# Patient Record
Sex: Female | Born: 2005 | Race: Black or African American | Hispanic: No | Marital: Single | Smoking: Never smoker
Health system: Southern US, Community
[De-identification: ages and names within clinical notes are randomized; demographics above are authoritative.]

## PROBLEM LIST (undated history)

## (undated) DIAGNOSIS — J45909 Unspecified asthma, uncomplicated: Secondary | ICD-10-CM

---
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
## 2023-03-28 IMAGING — DX DG FOOT COMPLETE 3+V*R*
3 series · 3 of 3 positions shown · non-contrast
Comparison: None.

CLINICAL DATA: Stepped on nail a few days ago. Question foreign
object.

EXAM:
RIGHT FOOT COMPLETE - 3+ VIEW

[foot ap]
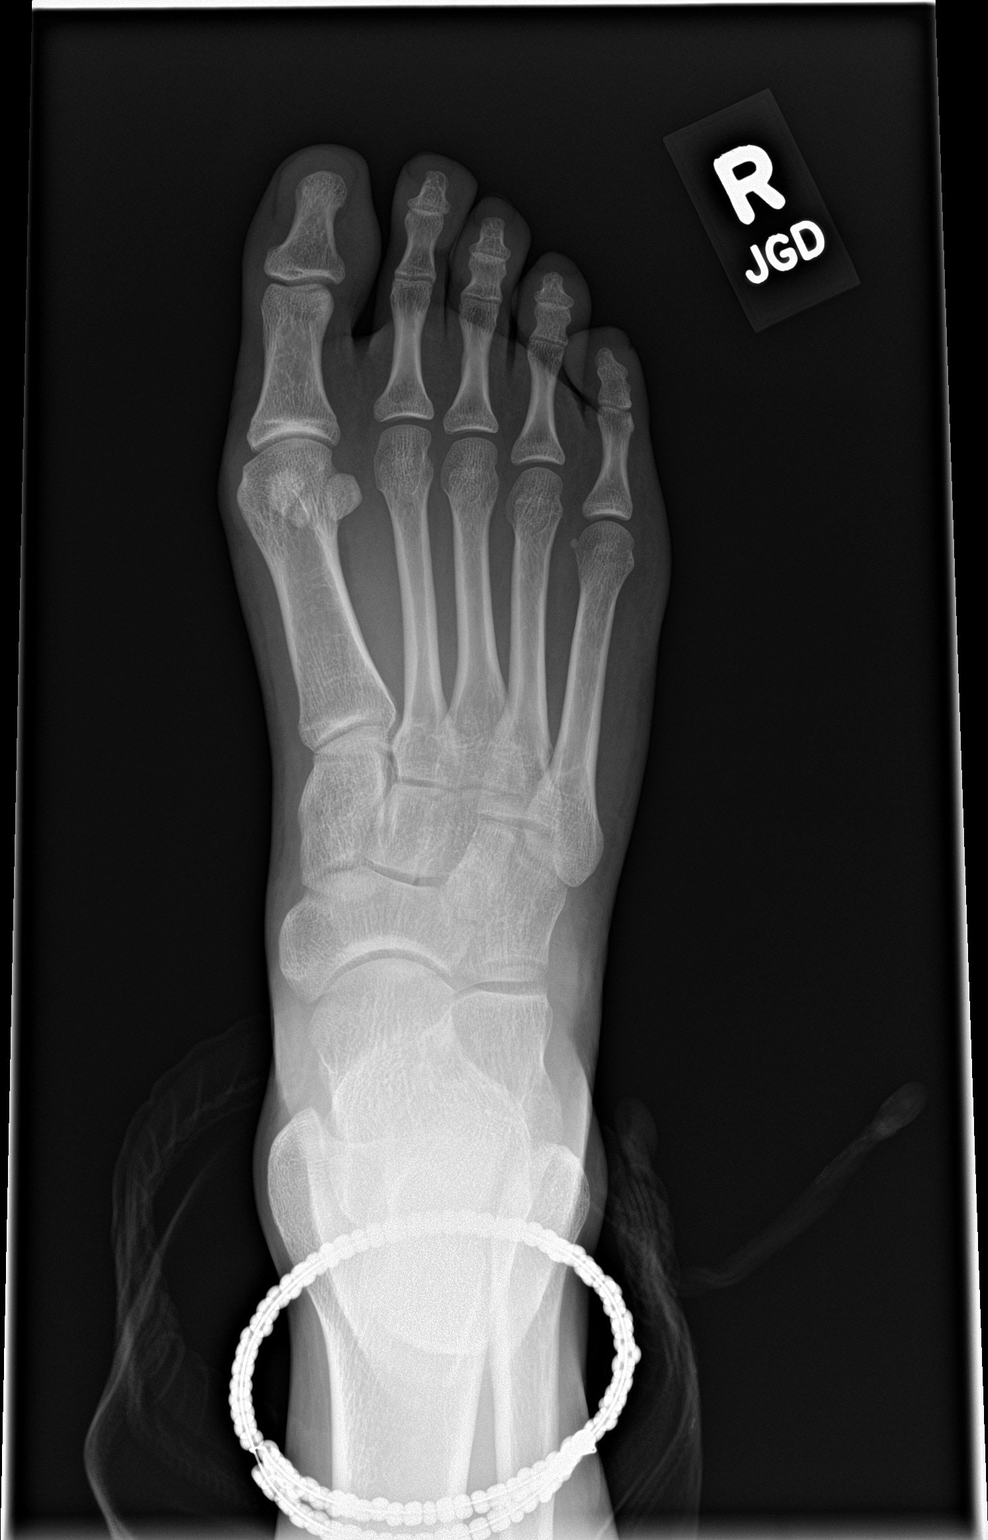

[foot obl]
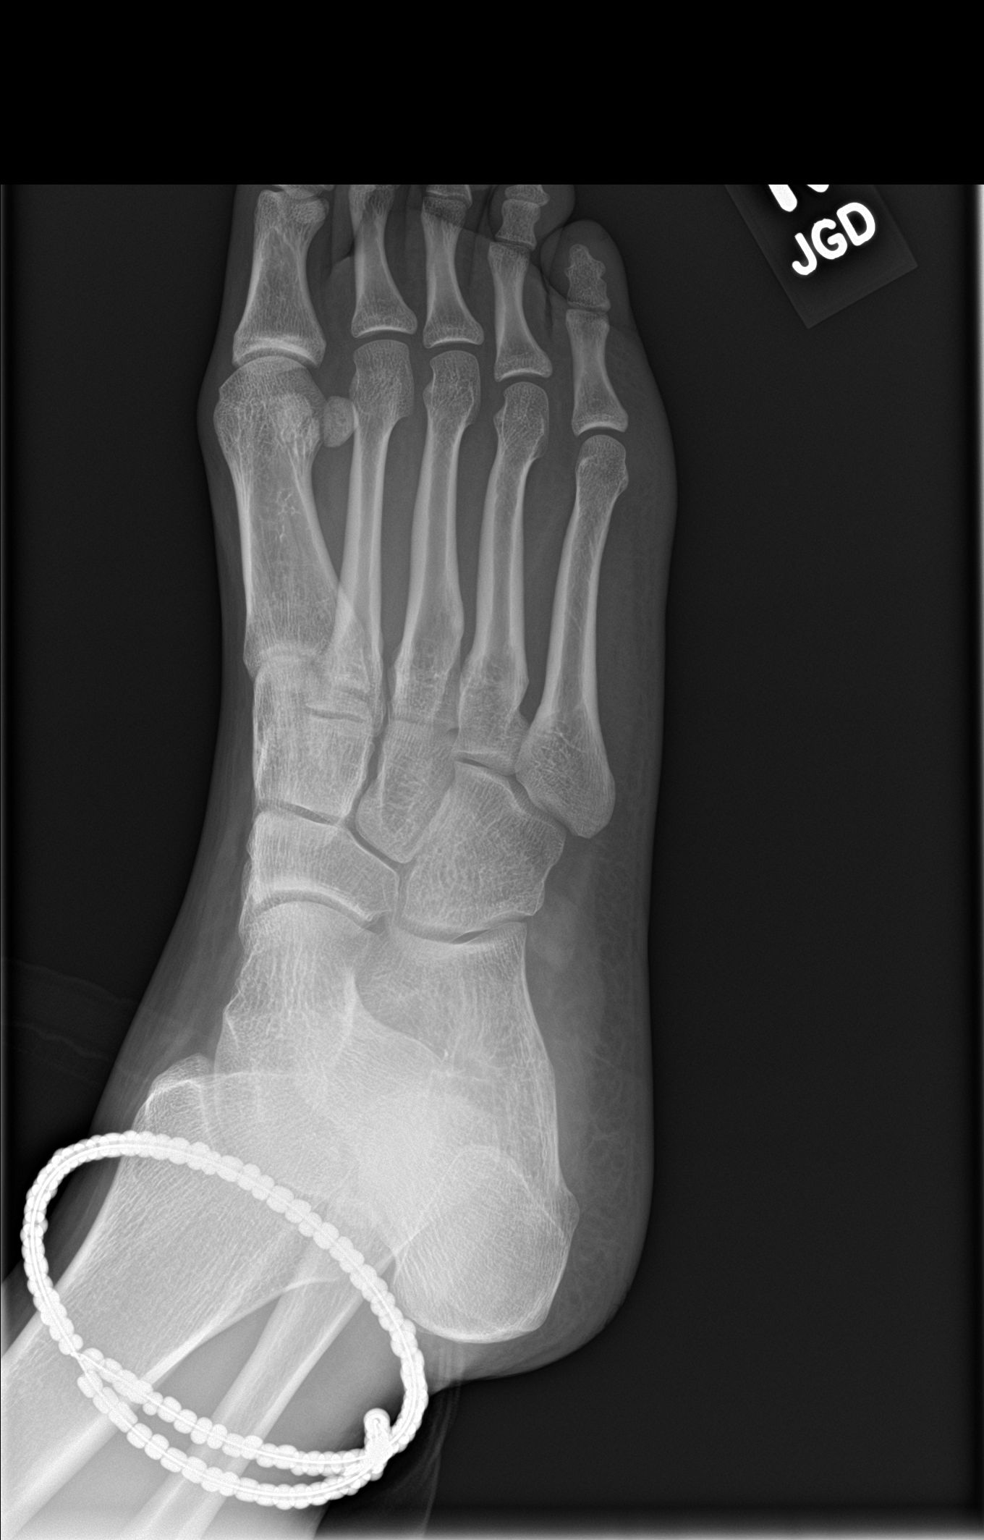

[foot lat]
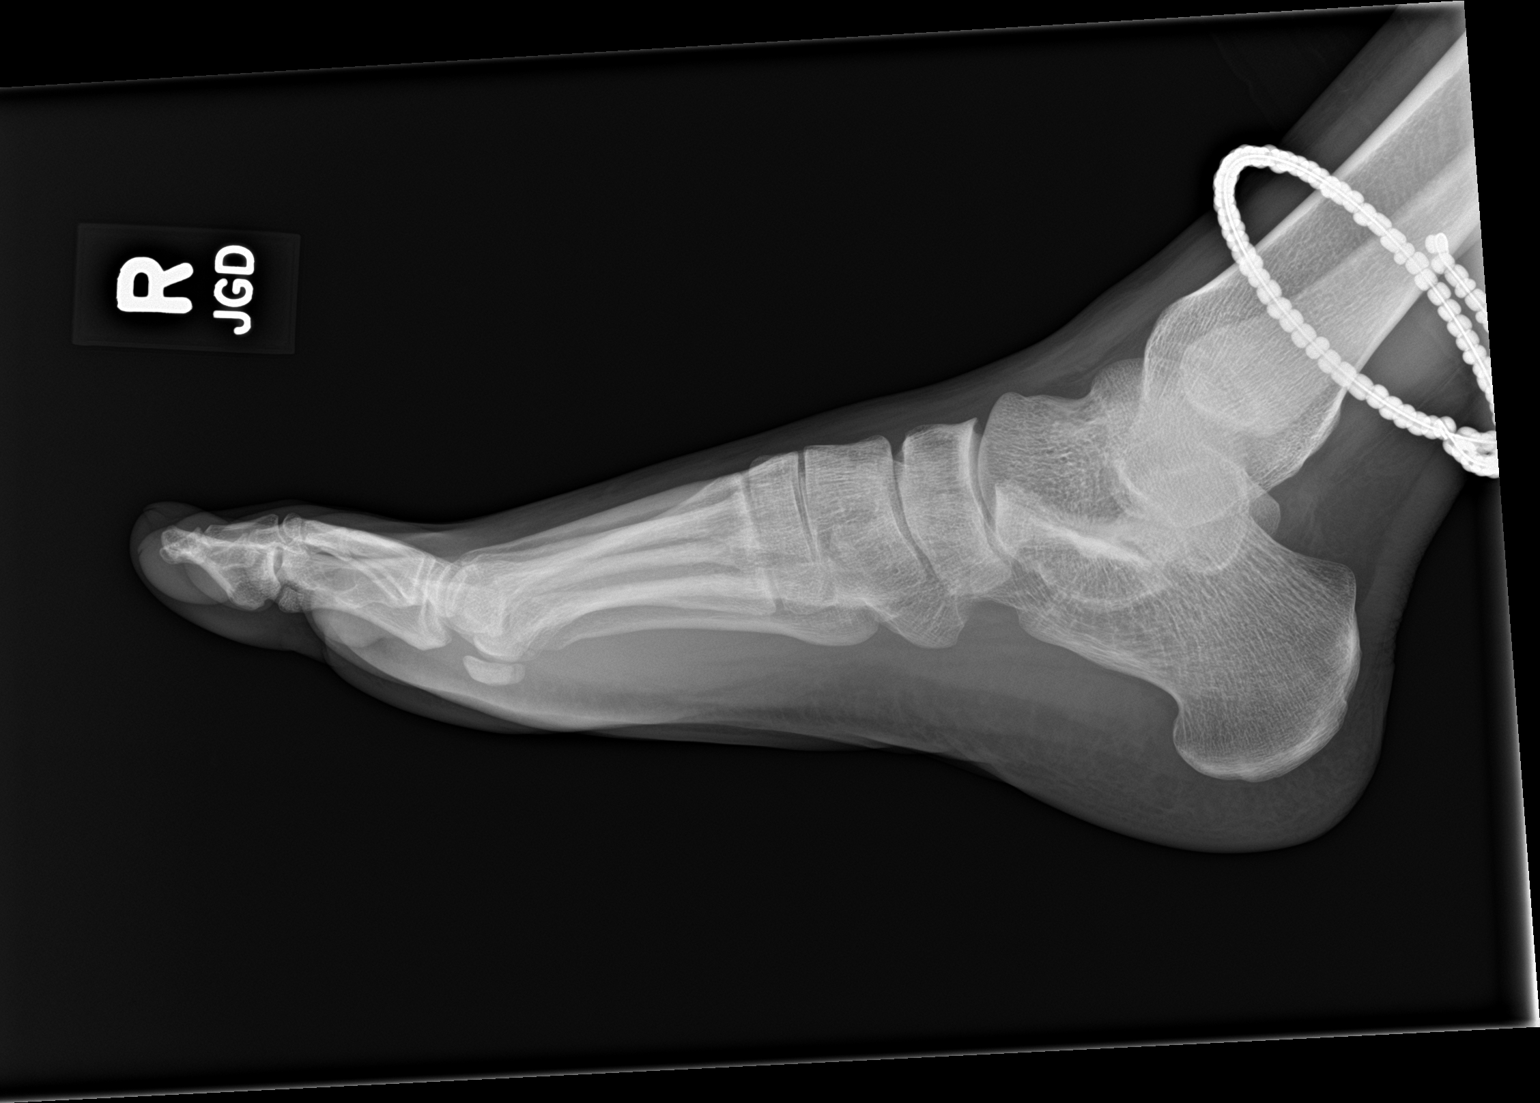

[3 of 3 positions shown; findings below may reference images not displayed]

FINDINGS: No evidence of fracture. No visible air in the soft tissues. No
radiopaque foreign object.
IMPRESSION: Normal radiographs. No radiopaque foreign object. No fracture or
air/gas in the soft tissues.

## 2023-04-01 ENCOUNTER — Encounter (HOSPITAL_COMMUNITY): Payer: Self-pay

## 2023-04-01 ENCOUNTER — Ambulatory Visit (HOSPITAL_COMMUNITY)
Admission: EM | Admit: 2023-04-01 | Discharge: 2023-04-01 | Disposition: A | Discharge status: Home / Self Care | Payer: Medicaid Other | Attending: Family Medicine | Admitting: Family Medicine

## 2023-04-01 DIAGNOSIS — L301 Dyshidrosis [pompholyx]: Secondary | ICD-10-CM

## 2023-04-01 MED ORDER — CLOTRIMAZOLE-BETAMETHASONE 1-0.05 % EX CREA: TOPICAL_CREAM | Freq: Two times a day (BID) | CUTANEOUS | 0 refills | Status: DC

## 2023-04-01 MED ORDER — VALACYCLOVIR HCL 1 G PO TABS: 2000.0000 mg | ORAL_TABLET | Freq: Two times a day (BID) | ORAL | 0 refills | Status: AC

## 2023-04-01 NOTE — ED Triage Notes (Signed)
Patient here today with c/o fungal infection on the bottom of her feet X 1 week. Patient states that they itch and has a burning sensation on the bottom of her feet. She states that she had this a year ago after stepping on a rusty nail in the woods. She was treated for it a year ago. She has tried using an antifungal cream and powder with some relief in the beginning but then it seemed to worsen again.   She is also concerned about a sore on the left side bottom lip X 2 days.

## 2023-04-01 NOTE — Discharge Instructions (Signed)
Valacyclovir 1000 mg--take 2 tablets by mouth every 12 hours for 2 doses  Clotrimazole-betamethasone cream--apply 2 times a day to the rash area on your foot and toes, for about 2 weeks.  Also try to keep your feet dryer.  You can change into dry socks if your socks get wet or sweaty

## 2023-04-01 NOTE — ED Provider Notes (Signed)
[facility]    CSN: [id number] Arrival date & time: 04/01/23  1201      History   Chief Complaint Chief Complaint  Patient presents with   Foot Fungus    HPI Holly Norris is a 17 y.o. female.   HPI Here for painful lump on her left lower lip near the corner of the mouth.  It began bothering her about 2 days ago.   She has also had some chronic trouble with some scaling and itchy rash on her sole of her right foot; it began bothering her about a year ago and she has tried some antifungal cream on it and it only helped the first couple of days.   Past Medical History:  Diagnosis Date   Asthma     There are no problems to display for this patient.   History reviewed. No pertinent surgical history.  OB History   No obstetric history on file.      Home Medications    Prior to Admission medications   Medication Sig Start Date End Date Taking? Authorizing Provider  clotrimazole-betamethasone (LOTRISONE) cream Apply topically 2 (two) times daily. To affected area on foot/toes till better, for about 2 weeks 04/01/23  Yes Mackinzee Roszak, Janace Aris, MD  valACYclovir (VALTREX) 1000 MG tablet Take 2 tablets (2,000 mg total) by mouth 2 (two) times daily for 2 doses. 04/01/23 04/02/23 Yes Zenia Resides, MD  albuterol (PROVENTIL) (5 MG/ML) 0.5% nebulizer solution Take 2.5 mg by nebulization every 6 (six) hours as needed.    [provider]  albuterol (PROVENTIL) (5 MG/ML) 0.5% nebulizer solution Take 0.5 mLs (2.5 mg total) by nebulization every 6 (six) hours as needed for wheezing or shortness of breath. 05/20/12   Saverio Danker, MD  albuterol (VENTOLIN HFA) 108 (90 Base) MCG/ACT inhaler Inhale 1-2 puffs into the lungs every 6 (six) hours as needed for wheezing or shortness of breath.    [provider]  ibuprofen 100 MG/5ML suspension Take 30 mLs (600 mg total) by mouth every 6 (six) hours as needed. 02/23/22   Carlisle Beers, FNP   ondansetron (ZOFRAN) 4 MG tablet Take 1 tablet (4 mg total) by mouth every 6 (six) hours. 08/16/21   Valinda Hoar, NP    Family History History reviewed. No pertinent family history.  Social History Social History   Tobacco Use   Smoking status: Never  Substance Use Topics   Alcohol use: No   Drug use: Yes    Types: Marijuana    Comment: smokes daily not past 3 days due to throat     Allergies   Patient has no known allergies.   Review of Systems Review of Systems   Physical Exam Triage Vital Signs ED Triage Vitals  Encounter Vitals Group     BP 04/01/23 1328 108/69     Systolic BP Percentile --      Diastolic BP Percentile --      Pulse Rate 04/01/23 1328 (!) 59     Resp 04/01/23 1328 16     Temp 04/01/23 1328 98.7 F (37.1 C)     Temp Source 04/01/23 1328 Oral     SpO2 04/01/23 1328 98 %     Weight 04/01/23 1328 134 lb (60.8 kg)     Height 04/01/23 1328 5\' 7"  (1.702 m)     Head Circumference --      Peak Flow --      Pain Score 04/01/23 1324  4     Pain Loc --      Pain Education --      Exclude from Growth Chart --    No data found.  Updated Vital Signs BP 108/69 (BP Location: Right Arm)   Pulse (!) 59   Temp 98.7 F (37.1 C) (Oral)   Resp 16   Ht 5\' 7"  (1.702 m)   Wt 60.8 kg   LMP 03/04/2023 (Approximate)   SpO2 98%   BMI 20.99 kg/m   Visual Acuity Right Eye Distance:   Left Eye Distance:   Bilateral Distance:    Right Eye Near:   Left Eye Near:    Bilateral Near:     Physical Exam Vitals reviewed.  Constitutional:      General: She is not in acute distress.    Appearance: She is not ill-appearing, toxic-appearing or diaphoretic.  HENT:     Mouth/Throat:     Mouth: Mucous membranes are moist.     Comments: There is an ulceration about 4 5 mm in diameter at the lower lip at the corner of the mouth on the left.  There is no drainage and there is mild swelling around it   Skin:    Coloration: Skin is not pale.     Comments:  On the sole of her right foot over the ball and in between the toes there is some scaly rash.  No drainage and no erythema.  No swelling.  There is a well-healed scar on the ball of her foot where she had a puncture wound in January 2023.  Neurological:     General: No focal deficit present.     Mental Status: She is alert and oriented to person, place, and time.  Psychiatric:        Behavior: Behavior normal.      UC Treatments / Results  Labs (all labs ordered are listed, but only abnormal results are displayed) Labs Reviewed - No data to display  EKG   Radiology No results found.  Procedures Procedures (including critical care time)  Medications Ordered in UC Medications - No data to display  Initial Impression / Assessment and Plan / UC Course  I have reviewed the triage vital signs and the nursing notes.  Pertinent labs & imaging results that were available during my care of the patient were reviewed by me and considered in my medical decision making (see chart for details).        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  Lotrisone is sent in to treat the dermatitis on her feet.  I suspect it is more of a dyshidrosis than fungal, but that way she will have treatment for both problems. Final Clinical Impressions(s) / UC Diagnoses   Final diagnoses:  Dyshydrosis     Discharge Instructions      Valacyclovir 1000 mg--take 2 tablets by mouth every 12 hours for 2 doses  Clotrimazole-betamethasone cream--apply 2 times a day to the rash area on your foot and toes, for about 2 weeks.  Also try to keep your feet dryer.  You can change into dry socks if your socks get wet or sweaty     ED Prescriptions     Medication Sig Dispense Auth. Provider   valACYclovir (VALTREX) 1000 MG tablet Take 2 tablets (2,000 mg total) by mouth 2 (two) times daily for 2 doses. 4 tablet Zenia Resides, MD   clotrimazole-betamethasone (LOTRISONE) cream Apply topically 2  (two) times  daily. To affected area on foot/toes till better, for about 2 weeks 45 g Judithe Keetch, Janace Aris, MD      PDMP not reviewed this encounter.   Zenia Resides, MD 04/01/23 [phone]

## 2023-04-17 ENCOUNTER — Encounter (HOSPITAL_COMMUNITY): Payer: Self-pay

## 2023-04-17 ENCOUNTER — Ambulatory Visit (HOSPITAL_COMMUNITY)
Admission: EM | Admit: 2023-04-17 | Discharge: 2023-04-17 | Disposition: A | Discharge status: Home / Self Care | Payer: Medicaid Other | Attending: Emergency Medicine | Admitting: Emergency Medicine

## 2023-04-17 DIAGNOSIS — J4520 Mild intermittent asthma, uncomplicated: Secondary | ICD-10-CM | POA: Insufficient documentation

## 2023-04-17 DIAGNOSIS — J069 Acute upper respiratory infection, unspecified: Secondary | ICD-10-CM | POA: Diagnosis present

## 2023-04-17 DIAGNOSIS — Z20822 Contact with and (suspected) exposure to covid-19: Secondary | ICD-10-CM | POA: Insufficient documentation

## 2023-04-17 MED ORDER — ALBUTEROL SULFATE HFA 108 (90 BASE) MCG/ACT IN AERS: 2.0000 | INHALATION_SPRAY | Freq: Four times a day (QID) | RESPIRATORY_TRACT | 1 refills | Status: AC | PRN

## 2023-04-17 MED ORDER — FLUTICASONE PROPIONATE 50 MCG/ACT NA SUSP: 2.0000 | Freq: Every day | NASAL | 2 refills | Status: AC

## 2023-04-17 MED ORDER — IBUPROFEN 100 MG/5ML PO SUSP: 400.0000 mg | Freq: Four times a day (QID) | ORAL | 0 refills | Status: AC | PRN

## 2023-04-17 MED ORDER — CETIRIZINE HCL 10 MG PO CHEW: 10.0000 mg | CHEWABLE_TABLET | Freq: Every day | ORAL | 1 refills | Status: AC

## 2023-04-17 NOTE — Discharge Instructions (Signed)
I recommend daily zyrtec (chewable tablet) and nasal spray like flonase This will help with runny nose and congestion You can continue nyquil if helpful Drink lots of water! I have refilled your inhaler and liquid ibuprofen   We will call you if your covid test returns positive. (~24 hours)

## 2023-04-17 NOTE — ED Triage Notes (Signed)
Runny Nose; Headache; Sore Throat onset Monday. Patient states her grandmother tested positive for COVID. Patient's home test was negative.   Patient has tried Nyquil cold and flu vapor with slight relief. Using oregano oil as well.

## 2023-04-17 NOTE — ED Provider Notes (Signed)
[facility]    CSN: [id number] Arrival date & time: 04/17/23  1247      History   Chief Complaint Chief Complaint  Patient presents with   Nasal Congestion   Sore Throat    HPI Romiyah Elk is a 17 y.o. female.  4-5 day history of nasal congestion, runny nose, slight cough, sore throat Her grandma lives with her and tested positive for COVID Patient had 2 negative COVID tests on the first 2 days of her symptoms No fever.  Denies abdominal pain, NVD, rash.  Decreased appetite but drinking lots of fluids  Has used NyQuil that is helpful  History of asthma. Requesting albuterol inhaler refill.  Denies wheezing, chest tightness, shortness of breath.  Past Medical History:  Diagnosis Date   Asthma     There are no problems to display for this patient.   History reviewed. No pertinent surgical history.  OB History   No obstetric history on file.      Home Medications    Prior to Admission medications   Medication Sig Start Date End Date Taking? Authorizing Provider  albuterol (VENTOLIN HFA) 108 (90 Base) MCG/ACT inhaler Inhale 2 puffs into the lungs every 6 (six) hours as needed for wheezing or shortness of breath. 04/17/23  Yes Elen Acero, Lurena Joiner, PA-C  cetirizine (ZYRTEC) 10 MG chewable tablet Chew 1 tablet (10 mg total) by mouth daily. 04/17/23  Yes Temple Ewart, Lurena Joiner, PA-C  fluticasone (FLONASE) 50 MCG/ACT nasal spray Place 2 sprays into both nostrils daily. 04/17/23  Yes Ellese Julius, Lurena Joiner, PA-C  ibuprofen (ADVIL) 100 MG/5ML suspension Take 20 mLs (400 mg total) by mouth every 6 (six) hours as needed. 04/17/23  Yes Lukasz Rogus, Ray Church    Family History History reviewed. No pertinent family history.  Social History Social History   Tobacco Use   Smoking status: Never  Substance Use Topics   Alcohol use: No   Drug use: Yes    Types: Marijuana    Comment: smokes daily not past 3 days due to throat     Allergies   Patient has no known  allergies.   Review of Systems Review of Systems As per HPI  Physical Exam Triage Vital Signs ED Triage Vitals  Encounter Vitals Group     BP 04/17/23 1420 123/72     Systolic BP Percentile --      Diastolic BP Percentile --      Pulse Rate 04/17/23 1420 68     Resp 04/17/23 1420 18     Temp 04/17/23 1420 98.2 F (36.8 C)     Temp Source 04/17/23 1420 Oral     SpO2 04/17/23 1420 98 %     Weight --      Height 04/17/23 1420 5\' 7"  (1.702 m)     Head Circumference --      Peak Flow --      Pain Score 04/17/23 1419 4     Pain Loc --      Pain Education --      Exclude from Growth Chart --    No data found.  Updated Vital Signs BP 123/72 (BP Location: Left Arm)   Pulse 68   Temp 98.2 F (36.8 C) (Oral)   Resp 18   Ht 5\' 7"  (1.702 m)   LMP 04/04/2023 (Approximate)   SpO2 98%   BMI 20.99 kg/m   Visual Acuity Right Eye Distance:   Left Eye Distance:   Bilateral Distance:  Right Eye Near:   Left Eye Near:    Bilateral Near:     Physical Exam Vitals and nursing note reviewed.  Constitutional:      Appearance: She is not ill-appearing.  HENT:     Right Ear: Tympanic membrane and ear canal normal.     Left Ear: Tympanic membrane and ear canal normal.     Nose: Congestion present. No rhinorrhea.     Mouth/Throat:     Mouth: Mucous membranes are moist.     Pharynx: Oropharynx is clear. No posterior oropharyngeal erythema.  Eyes:     Conjunctiva/sclera: Conjunctivae normal.  Cardiovascular:     Rate and Rhythm: Normal rate and regular rhythm.     Pulses: Normal pulses.     Heart sounds: Normal heart sounds.  Pulmonary:     Effort: Pulmonary effort is normal.     Breath sounds: Normal breath sounds.     Comments: Clear throughout.  Normal work of breathing. Abdominal:     Palpations: Abdomen is soft.     Tenderness: There is no abdominal tenderness.  Musculoskeletal:     Cervical back: Normal range of motion.  Lymphadenopathy:     Cervical: No cervical  adenopathy.  Skin:    General: Skin is warm and dry.  Neurological:     Mental Status: She is alert and oriented to person, place, and time.      UC Treatments / Results  Labs (all labs ordered are listed, but only abnormal results are displayed) Labs Reviewed  SARS CORONAVIRUS 2 (TAT 6-24 HRS)    EKG   Radiology No results found.  Procedures Procedures (including critical care time)  Medications Ordered in UC Medications - No data to display  Initial Impression / Assessment and Plan / UC Course  I have reviewed the triage vital signs and the nursing notes.  Pertinent labs & imaging results that were available during my care of the patient were reviewed by me and considered in my medical decision making (see chart for details).  Afebrile in clinic.  Clear lungs. COVID test pending.  Discussed likely viral etiology and continuing symptomatic care at home.  Advised adding Zyrtec and nasal spray for her runny nose and congestion.  Continue NyQuil.  Increase fluids.  Refilled albuterol inhaler. Patient was also requesting refill of her liquid ibuprofen (she cannot take pills) Discussed return precautions.  Work note provided.  Patient agreeable to plan  Final Clinical Impressions(s) / UC Diagnoses   Final diagnoses:  Viral URI with cough  Close exposure to COVID-19 virus     Discharge Instructions      I recommend daily zyrtec (chewable tablet) and nasal spray like flonase This will help with runny nose and congestion You can continue nyquil if helpful Drink lots of water! I have refilled your inhaler and liquid ibuprofen   We will call you if your covid test returns positive. (~24 hours)    ED Prescriptions     Medication Sig Dispense Auth. Provider   albuterol (VENTOLIN HFA) 108 (90 Base) MCG/ACT inhaler Inhale 2 puffs into the lungs every 6 (six) hours as needed for wheezing or shortness of breath. 18 g Jaidon Sponsel, PA-C   fluticasone (FLONASE) 50  MCG/ACT nasal spray Place 2 sprays into both nostrils daily. 16 mL Zayanna Pundt, PA-C   cetirizine (ZYRTEC) 10 MG chewable tablet Chew 1 tablet (10 mg total) by mouth daily. 30 tablet Zahara Rembert, PA-C   ibuprofen (ADVIL) 100 MG/5ML suspension  Take 20 mLs (400 mg total) by mouth every 6 (six) hours as needed. 473 mL Dontray Haberland, Lurena Joiner, PA-C      PDMP not reviewed this encounter.   Woodley Petzold, Ray Church 04/17/23 1605

## 2023-04-18 LAB — SARS CORONAVIRUS 2 (TAT 6-24 HRS): SARS Coronavirus 2: POSITIVE — AB
